# Patient Record
Sex: Female | Born: 1990 | Hispanic: Yes | Marital: Single | Smoking: Never smoker
Health system: Southern US, Community
[De-identification: ages and names within clinical notes are randomized; demographics above are authoritative.]

## PROBLEM LIST (undated history)

## (undated) HISTORY — PX: CHOLECYSTECTOMY: SHX55

---
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
## 2011-08-03 ENCOUNTER — Emergency Department: Payer: Self-pay | Admitting: Emergency Medicine

## 2011-08-03 LAB — COMPREHENSIVE METABOLIC PANEL
Albumin: 4.1 g/dL (ref 3.8–5.6)
Alkaline Phosphatase: 69 U/L — ABNORMAL LOW (ref 82–169)
Bilirubin,Total: 0.7 mg/dL (ref 0.2–1.0)
Co2: 23 mmol/L (ref 21–32)
Creatinine: 0.69 mg/dL (ref 0.60–1.30)
EGFR (Non-African Amer.): >60
Glucose: 95 mg/dL (ref 65–99)
Osmolality: 280 (ref 275–301)
Sodium: 140 mmol/L (ref 136–145)

## 2011-08-03 LAB — URINALYSIS, COMPLETE
Bilirubin,UR: NEGATIVE
Nitrite: NEGATIVE
Ph: 6 (ref 4.5–8.0)
Protein: NEGATIVE
RBC,UR: <1 /HPF (ref 0–5)
Squamous Epithelial: 2

## 2011-08-03 LAB — CBC
HCT: 37.9 % (ref 35.0–47.0)
Platelet: 211 10*3/uL (ref 150–440)
RBC: 4.23 10*6/uL (ref 3.80–5.20)
WBC: 5.9 10*3/uL (ref 3.6–11.0)

## 2011-08-03 LAB — TROPONIN I: Troponin-I: <0.02 ng/mL

## 2011-08-03 LAB — CK TOTAL AND CKMB (NOT AT ARMC): CK-MB: 1.7 ng/mL (ref 0.5–3.6)

## 2011-08-03 LAB — PREGNANCY, URINE: Pregnancy Test, Urine: NEGATIVE m[IU]/mL

## 2012-01-16 ENCOUNTER — Emergency Department: Payer: Self-pay | Admitting: Emergency Medicine

## 2012-01-16 LAB — URINALYSIS, COMPLETE
Bacteria: NONE SEEN
Glucose,UR: NEGATIVE mg/dL (ref 0–75)
Ketone: NEGATIVE
Nitrite: NEGATIVE
Protein: NEGATIVE
Specific Gravity: 1.01 (ref 1.003–1.030)
Squamous Epithelial: 5
WBC UR: 20 /HPF (ref 0–5)

## 2012-11-18 ENCOUNTER — Emergency Department: Payer: Self-pay | Admitting: Emergency Medicine

## 2012-11-18 LAB — BASIC METABOLIC PANEL
Anion Gap: 14 (ref 7–16)
BUN: 12 mg/dL (ref 7–18)
Chloride: 100 mmol/L (ref 98–107)
Co2: 23 mmol/L (ref 21–32)
Creatinine: 0.88 mg/dL (ref 0.60–1.30)
EGFR (Non-African Amer.): >60
Potassium: 3.3 mmol/L — ABNORMAL LOW (ref 3.5–5.1)
Sodium: 137 mmol/L (ref 136–145)

## 2012-11-18 LAB — URINALYSIS, COMPLETE
Glucose,UR: NEGATIVE mg/dL (ref 0–75)
Ph: 6 (ref 4.5–8.0)
Protein: NEGATIVE
RBC,UR: 2 /HPF (ref 0–5)
Squamous Epithelial: 7

## 2012-11-18 LAB — DRUG SCREEN, URINE
Amphetamines, Ur Screen: NEGATIVE (ref ?–1000)
Barbiturates, Ur Screen: NEGATIVE (ref ?–200)
Cocaine Metabolite,Ur ~~LOC~~: NEGATIVE (ref ?–300)
MDMA (Ecstasy)Ur Screen: NEGATIVE (ref ?–500)
Methadone, Ur Screen: NEGATIVE (ref ?–300)

## 2012-11-18 LAB — CBC
MCHC: 34 g/dL (ref 32.0–36.0)
MCV: 89 fL (ref 80–100)
Platelet: 299 10*3/uL (ref 150–440)
RBC: 5.18 10*6/uL (ref 3.80–5.20)
RDW: 13.5 % (ref 11.5–14.5)
WBC: 10.4 10*3/uL (ref 3.6–11.0)

## 2012-12-20 ENCOUNTER — Emergency Department: Payer: Self-pay | Admitting: Emergency Medicine

## 2012-12-20 LAB — URINALYSIS, COMPLETE
Bilirubin,UR: NEGATIVE
Glucose,UR: NEGATIVE mg/dL (ref 0–75)
Specific Gravity: 1.023 (ref 1.003–1.030)
WBC UR: 7 /HPF (ref 0–5)

## 2012-12-20 LAB — CBC
HCT: 41.3 % (ref 35.0–47.0)
HGB: 13.9 g/dL (ref 12.0–16.0)
MCH: 30.4 pg (ref 26.0–34.0)
MCHC: 33.6 g/dL (ref 32.0–36.0)
MCV: 91 fL (ref 80–100)
Platelet: 256 10*3/uL (ref 150–440)
RBC: 4.56 10*6/uL (ref 3.80–5.20)
RDW: 13.8 % (ref 11.5–14.5)
WBC: 12 10*3/uL — ABNORMAL HIGH (ref 3.6–11.0)

## 2012-12-20 LAB — WET PREP, GENITAL

## 2012-12-20 LAB — GC/CHLAMYDIA PROBE AMP

## 2014-07-31 ENCOUNTER — Emergency Department: Payer: Self-pay | Admitting: Student

## 2018-06-10 ENCOUNTER — Encounter: Payer: Self-pay | Admitting: Emergency Medicine

## 2018-06-10 ENCOUNTER — Emergency Department
Admission: EM | Admit: 2018-06-10 | Discharge: 2018-06-10 | Disposition: A | Discharge status: Home / Self Care | Payer: Medicaid Other | Attending: Emergency Medicine | Admitting: Emergency Medicine

## 2018-06-10 ENCOUNTER — Other Ambulatory Visit: Payer: Self-pay

## 2018-06-10 DIAGNOSIS — Z5321 Procedure and treatment not carried out due to patient leaving prior to being seen by health care provider: Secondary | ICD-10-CM | POA: Diagnosis not present

## 2018-06-10 DIAGNOSIS — R1084 Generalized abdominal pain: Secondary | ICD-10-CM | POA: Insufficient documentation

## 2018-06-10 LAB — CBC
HCT: 39.7 % (ref 36.0–46.0)
HEMOGLOBIN: 13.3 g/dL (ref 12.0–15.0)
MCH: 30.1 pg (ref 26.0–34.0)
MCHC: 33.5 g/dL (ref 30.0–36.0)
MCV: 89.8 fL (ref 80.0–100.0)
NRBC: 0 % (ref 0.0–0.2)
PLATELETS: 290 10*3/uL (ref 150–400)
RBC: 4.42 MIL/uL (ref 3.87–5.11)
RDW: 12 % (ref 11.5–15.5)
WBC: 9.3 10*3/uL (ref 4.0–10.5)

## 2018-06-10 LAB — URINALYSIS, COMPLETE (UACMP) WITH MICROSCOPIC
BACTERIA UA: NONE SEEN
Bilirubin Urine: NEGATIVE
GLUCOSE, UA: NEGATIVE mg/dL
Hgb urine dipstick: NEGATIVE
KETONES UR: NEGATIVE mg/dL
NITRITE: NEGATIVE
PH: 5 (ref 5.0–8.0)
Protein, ur: NEGATIVE mg/dL
Specific Gravity, Urine: 1.018 (ref 1.005–1.030)

## 2018-06-10 LAB — BASIC METABOLIC PANEL
ANION GAP: 9 (ref 5–15)
BUN: 8 mg/dL (ref 6–20)
CALCIUM: 9.5 mg/dL (ref 8.9–10.3)
CO2: 21 mmol/L — AB (ref 22–32)
Chloride: 106 mmol/L (ref 98–111)
Creatinine, Ser: 0.53 mg/dL (ref 0.44–1.00)
GLUCOSE: 107 mg/dL — AB (ref 70–99)
POTASSIUM: 3.7 mmol/L (ref 3.5–5.1)
Sodium: 136 mmol/L (ref 135–145)

## 2018-06-10 LAB — POCT PREGNANCY, URINE: Preg Test, Ur: POSITIVE — AB

## 2018-06-10 NOTE — ED Notes (Signed)
Called for VS, not in waiting room.

## 2018-06-10 NOTE — ED Notes (Signed)
Called for revital, not in WR.

## 2018-06-10 NOTE — ED Triage Notes (Signed)
C/O Right lower back / flank / back pain x 3 days.  Seen through [facility] ED two days ago for same complaint.  Started on Antibiotics - Patient has only taken one tablet of antibiotics today.

## 2018-06-10 NOTE — ED Notes (Signed)
Called for revital, not in WR. 

## 2019-09-25 ENCOUNTER — Emergency Department: Payer: Medicaid Other

## 2019-09-25 ENCOUNTER — Emergency Department
Admission: EM | Admit: 2019-09-25 | Discharge: 2019-09-25 | Disposition: A | Discharge status: Home / Self Care | Payer: Medicaid Other | Attending: Student | Admitting: Student

## 2019-09-25 ENCOUNTER — Other Ambulatory Visit: Payer: Self-pay

## 2019-09-25 ENCOUNTER — Encounter: Payer: Self-pay | Admitting: Emergency Medicine

## 2019-09-25 DIAGNOSIS — Z79899 Other long term (current) drug therapy: Secondary | ICD-10-CM | POA: Diagnosis not present

## 2019-09-25 DIAGNOSIS — U071 COVID-19: Secondary | ICD-10-CM | POA: Insufficient documentation

## 2019-09-25 DIAGNOSIS — J189 Pneumonia, unspecified organism: Secondary | ICD-10-CM | POA: Insufficient documentation

## 2019-09-25 DIAGNOSIS — R0602 Shortness of breath: Secondary | ICD-10-CM | POA: Diagnosis present

## 2019-09-25 DIAGNOSIS — Z793 Long term (current) use of hormonal contraceptives: Secondary | ICD-10-CM | POA: Diagnosis not present

## 2019-09-25 LAB — BASIC METABOLIC PANEL
Anion gap: 10 (ref 5–15)
BUN: 11 mg/dL (ref 6–20)
CO2: 22 mmol/L (ref 22–32)
Calcium: 8.7 mg/dL — ABNORMAL LOW (ref 8.9–10.3)
Chloride: 108 mmol/L (ref 98–111)
Creatinine, Ser: 0.41 mg/dL — ABNORMAL LOW (ref 0.44–1.00)
GFR calc Af Amer: >60 mL/min (ref >60)
GFR calc non Af Amer: >60 mL/min (ref >60)
Glucose, Bld: 101 mg/dL — ABNORMAL HIGH (ref 70–99)
Potassium: 3.1 mmol/L — ABNORMAL LOW (ref 3.5–5.1)
Sodium: 140 mmol/L (ref 135–145)

## 2019-09-25 LAB — CBC
HCT: 39 % (ref 36.0–46.0)
Hemoglobin: 12.9 g/dL (ref 12.0–15.0)
MCH: 28.8 pg (ref 26.0–34.0)
MCHC: 33.1 g/dL (ref 30.0–36.0)
MCV: 87.1 fL (ref 80.0–100.0)
Platelets: 242 10*3/uL (ref 150–400)
RBC: 4.48 MIL/uL (ref 3.87–5.11)
RDW: 12.6 % (ref 11.5–15.5)
WBC: 7.3 10*3/uL (ref 4.0–10.5)
nRBC: 0 % (ref 0.0–0.2)

## 2019-09-25 LAB — SARS CORONAVIRUS 2 (TAT 6-24 HRS): SARS Coronavirus 2: POSITIVE — AB

## 2019-09-25 MED ORDER — AZITHROMYCIN 500 MG PO TABS
500.0000 mg | ORAL_TABLET | Freq: Once | ORAL | Status: AC
  Administered 2019-09-25: 09:00:00 500 mg via ORAL
  Filled 2019-09-25: qty 1

## 2019-09-25 MED ORDER — SODIUM CHLORIDE 0.9 % IV BOLUS
500.0000 mL | Freq: Once | INTRAVENOUS | Status: AC
  Administered 2019-09-25: 09:00:00 500 mL via INTRAVENOUS

## 2019-09-25 MED ORDER — ONDANSETRON HCL 4 MG PO TABS: 4.0000 mg | ORAL_TABLET | Freq: Every day | ORAL | 0 refills | Status: AC | PRN

## 2019-09-25 MED ORDER — POTASSIUM CHLORIDE CRYS ER 20 MEQ PO TBCR
40.0000 meq | EXTENDED_RELEASE_TABLET | Freq: Once | ORAL | Status: AC
  Administered 2019-09-25: 40 meq via ORAL
  Filled 2019-09-25: qty 2

## 2019-09-25 MED ORDER — SODIUM CHLORIDE 0.9 % IV BOLUS
500.0000 mL | Freq: Once | INTRAVENOUS | Status: AC
  Administered 2019-09-25: 11:00:00 500 mL via INTRAVENOUS

## 2019-09-25 MED ORDER — PROMETHAZINE HCL 25 MG/ML IJ SOLN: 6.2500 mg | Freq: Once | INTRAMUSCULAR | Status: DC

## 2019-09-25 MED ORDER — BENZONATATE 100 MG PO CAPS: 100.0000 mg | ORAL_CAPSULE | Freq: Three times a day (TID) | ORAL | 0 refills | Status: AC | PRN

## 2019-09-25 MED ORDER — POTASSIUM CHLORIDE ER 10 MEQ PO TBCR: 10.0000 meq | EXTENDED_RELEASE_TABLET | Freq: Every day | ORAL | 0 refills | Status: DC

## 2019-09-25 MED ORDER — DEXAMETHASONE SODIUM PHOSPHATE 10 MG/ML IJ SOLN
10.0000 mg | Freq: Once | INTRAMUSCULAR | Status: AC
  Administered 2019-09-25: 09:00:00 10 mg via INTRAVENOUS
  Filled 2019-09-25: qty 1

## 2019-09-25 MED ORDER — PREDNISONE 10 MG PO TABS: ORAL_TABLET | ORAL | 0 refills | Status: DC

## 2019-09-25 MED ORDER — ACETAMINOPHEN-CODEINE 120-12 MG/5ML PO SOLN
5.0000 mL | Freq: Once | ORAL | Status: AC
  Administered 2019-09-25: 09:00:00 5 mL via ORAL
  Filled 2019-09-25: qty 5

## 2019-09-25 MED ORDER — ALBUTEROL SULFATE HFA 108 (90 BASE) MCG/ACT IN AERS
2.0000 | INHALATION_SPRAY | Freq: Once | RESPIRATORY_TRACT | Status: AC
  Administered 2019-09-25: 11:00:00 2 via RESPIRATORY_TRACT
  Filled 2019-09-25: qty 6.7

## 2019-09-25 MED ORDER — PROMETHAZINE HCL 25 MG/ML IJ SOLN
12.5000 mg | Freq: Once | INTRAMUSCULAR | Status: AC
  Administered 2019-09-25: 11:00:00 12.5 mg via INTRAVENOUS
  Filled 2019-09-25: qty 1

## 2019-09-25 MED ORDER — ONDANSETRON HCL 4 MG/2ML IJ SOLN
4.0000 mg | Freq: Once | INTRAMUSCULAR | Status: AC
  Administered 2019-09-25: 09:00:00 4 mg via INTRAVENOUS
  Filled 2019-09-25: qty 2

## 2019-09-25 MED ORDER — AZITHROMYCIN 250 MG PO TABS: ORAL_TABLET | ORAL | 0 refills | Status: DC

## 2019-09-25 NOTE — ED Provider Notes (Signed)
[facility] Emergency Department Provider Note  ____________________________________________  Time seen: Approximately 8:06 AM  I have reviewed the triage vital signs and the nursing notes.   HISTORY  Chief Complaint Shortness of Breath, Cough, and Chest Pain    HPI Jaime Wells is a 29 y.o. female that presents to the emergency department for evaluation of continuous cough, shortness of breath, chest discomfort for 1.5 weeks.  Patient has a consistent usually nonproductive cough.  Occasionally she coughs up white phlegm. She is coughing so much, it makes her chest hurt.  No sick contacts.  She has not had a fever.  She did have some diarrhea but this resolved.  She has not been able to eat due to her nausea.  She feels dehydrated.  No nasal congestion, sore throat, vomiting.  History reviewed. No pertinent past medical history.  There are no problems to display for this patient.   Past Surgical History:  Procedure Laterality Date  . CHOLECYSTECTOMY      Prior to Admission medications   Medication Sig Start Date End Date Taking? Authorizing Provider  acetaminophen (TYLENOL) 325 MG tablet Take by mouth. 01/04/19  Yes [provider]  cetirizine (ZYRTEC) 10 MG tablet Take by mouth.   Yes [provider]  etonogestrel (NEXPLANON) 68 MG IMPL implant 1 each by Subdermal route once.   Yes [provider]  famotidine (PEPCID) 20 MG tablet Take by mouth. 06/06/18  Yes [provider]  azithromycin (ZITHROMAX Z-PAK) 250 MG tablet Take 2 tablets (500 mg) on  Day 1,  followed by 1 tablet (250 mg) once daily on Days 2 through 5. 09/25/19   Enid Derry, PA-C  benzonatate (TESSALON PERLES) 100 MG capsule Take 1 capsule (100 mg total) by mouth 3 (three) times daily as needed. 09/25/19 09/24/20  Enid Derry, PA-C  ondansetron (ZOFRAN) 4 MG tablet Take 1 tablet (4 mg total) by mouth daily as needed for nausea or vomiting. 09/25/19 09/24/20   Enid Derry, PA-C  potassium chloride (KLOR-CON) 10 MEQ tablet Take 1 tablet (10 mEq total) by mouth daily. 09/25/19   Enid Derry, PA-C  predniSONE (DELTASONE) 10 MG tablet Take 6 tablets on day 1, take 5 tablets on day 2, take 4 tablets on day 3, take 3 tablets on day 4, take 2 tablets on day 5, take 1 tablet on day 6 09/25/19   Enid Derry, PA-C    Allergies Patient has no known allergies.  No family history on file.  Social History Social History   Tobacco Use  . Smoking status: Never Smoker  . Smokeless tobacco: Never Used  Substance Use Topics  . Alcohol use: Not Currently  . Drug use: Not on file     Review of Systems  Constitutional: No fever/chills Eyes: No visual changes. No discharge. ENT: Negative for congestion and rhinorrhea. Cardiovascular: Positive for chest discomfort with coughing. Respiratory: Positive for cough and SOB. Gastrointestinal: No abdominal pain. Positive for nausea. No vomiting.  No diarrhea.  No constipation. Musculoskeletal: Negative for musculoskeletal pain. Skin: Negative for rash, abrasions, lacerations, ecchymosis. Neurological: Negative for headaches.   ____________________________________________   PHYSICAL EXAM:  VITAL SIGNS: ED Triage Vitals [09/25/19 0716]  Enc Vitals Group     BP 118/74     Pulse Rate 83     Resp 16     Temp 99.1 F (37.3 C)     Temp Source Oral     SpO2 99 %     Weight  170 lb (77.1 kg)     Height 5\' 1"  (1.549 m)     Head Circumference      Peak Flow      Pain Score 7     Pain Loc      Pain Edu?      Excl. in [place]?      Constitutional: Alert and oriented. Well appearing and in no acute distress. Eyes: Conjunctivae are normal. PERRL. EOMI. No discharge. Head: Atraumatic. ENT: No frontal and maxillary sinus tenderness.      Ears: Tympanic membranes pearly gray with good landmarks. No discharge.      Nose: No congestion/rhinnorhea.      Mouth/Throat: Mucous membranes are moist. Oropharynx  non-erythematous. Tonsils not enlarged. No exudates. Uvula midline. Neck: No stridor.   Hematological/Lymphatic/Immunilogical: No cervical lymphadenopathy. Cardiovascular: Normal rate, regular rhythm.  Good peripheral circulation. Respiratory: Persistent nonproductive cough.  Normal respiratory effort without tachypnea or retractions. Lungs CTAB. Good air entry to the bases with no decreased or absent breath sounds. Gastrointestinal: Bowel sounds 4 quadrants. Soft and nontender to palpation. No guarding or rigidity. No palpable masses. No distention. Musculoskeletal: Full range of motion to all extremities. No gross deformities appreciated. Neurologic:  Normal speech and language. No gross focal neurologic deficits are appreciated.  Skin:  Skin is warm, dry and intact. No rash noted. Psychiatric: Mood and affect are normal. Speech and behavior are normal. Patient exhibits appropriate insight and judgement.   ____________________________________________   LABS (all labs ordered are listed, but only abnormal results are displayed)  Labs Reviewed  BASIC METABOLIC PANEL - Abnormal; Notable for the following components:      Result Value   Potassium 3.1 (*)    Glucose, Bld 101 (*)    Creatinine, Ser 0.41 (*)    Calcium 8.7 (*)    All other components within normal limits  SARS CORONAVIRUS 2 (TAT 6-24 HRS)  CBC   ____________________________________________  EKG  SR ____________________________________________  RADIOLOGY Robinette Haines, personally viewed and evaluated these images (plain radiographs) as part of my medical decision making, as well as reviewing the written report by the radiologist.  DG Chest 2 View  Result Date: 09/25/2019 CLINICAL DATA:  Cough and chest pain EXAM: CHEST - 2 VIEW COMPARISON:  11/18/2012 FINDINGS: Patchy bilateral pulmonary infiltrate greatest in the subpleural left mid lung. No edema or effusion. Normal heart size and mediastinal contours.  IMPRESSION: Patchy bilateral pneumonia with atypical/viral pattern. Electronically Signed   By: Monte Fantasia M.D.   On: 09/25/2019 07:51    ____________________________________________    PROCEDURES  Procedure(s) performed:    Procedures    Medications  acetaminophen-codeine 120-12 MG/5ML solution 5 mL (5 mLs Oral Given 09/25/19 0903)  ondansetron (ZOFRAN) injection 4 mg (4 mg Intravenous Given 09/25/19 0916)  sodium chloride 0.9 % bolus 500 mL (0 mLs Intravenous Stopped 09/25/19 1114)  azithromycin (ZITHROMAX) tablet 500 mg (500 mg Oral Given 09/25/19 0903)  dexamethasone (DECADRON) injection 10 mg (10 mg Intravenous Given 09/25/19 0916)  albuterol (VENTOLIN HFA) 108 (90 Base) MCG/ACT inhaler 2 puff (2 puffs Inhalation Given 09/25/19 1054)  sodium chloride 0.9 % bolus 500 mL (0 mLs Intravenous Stopped 09/25/19 1204)  potassium chloride SA (KLOR-CON) CR tablet 40 mEq (40 mEq Oral Given 09/25/19 1128)  promethazine (PHENERGAN) injection 12.5 mg (12.5 mg Intravenous Given 09/25/19 1129)     ____________________________________________   INITIAL IMPRESSION / ASSESSMENT AND PLAN / ED COURSE  Pertinent labs & imaging results that were  available during my care of the patient were reviewed by me and considered in my medical decision making (see chart for details).  Review of the [place] CSRS was performed in accordance of the [facility] prior to dispensing any controlled drugs.   Patient's diagnosis is consistent with atypical pneumonia. Vital signs and exam are reassuring.  Chest x-ray consistent with patchy bilateral pneumonia with atypical pattern.  Covid test is pending.  Patient's oxygen remained at 100% with ambulation.  Patient was persistently coughing when she arrived to the ER.  Cough improved with Tylenol with codeine.  Patient felt improved after IV fluids, cough medication, IV Zofran, IV Phenergan, IV steroids.  She was given a dose of azithromycin in the emergency department.  She feels  comfortable going home.  Patient feels comfortable going home. Patient will be discharged home with prescriptions for prednisone, azithromycin, Zofran, potassium, Tessalon Perles. Patient is to follow up with primary care as needed or otherwise directed. Patient is given ED precautions to return to the ED for any worsening or new symptoms.  Jaime Wells was evaluated in Emergency Department on 09/25/2019 for the symptoms described in the history of present illness. She was evaluated in the context of the global COVID-19 pandemic, which necessitated consideration that the patient might be at risk for infection with the SARS-CoV-2 virus that causes COVID-19. Institutional protocols and algorithms that pertain to the evaluation of patients at risk for COVID-19 are in a state of rapid change based on information released by regulatory bodies including the CDC and federal and state organizations. These policies and algorithms were followed during the patient's care in the ED.   ____________________________________________  FINAL CLINICAL IMPRESSION(S) / ED DIAGNOSES  Final diagnoses:  Atypical pneumonia      NEW MEDICATIONS STARTED DURING THIS VISIT:  ED Discharge Orders         Ordered    predniSONE (DELTASONE) 10 MG tablet     09/25/19 1156    azithromycin (ZITHROMAX Z-PAK) 250 MG tablet     09/25/19 1156    benzonatate (TESSALON PERLES) 100 MG capsule  3 times daily PRN     09/25/19 1156    ondansetron (ZOFRAN) 4 MG tablet  Daily PRN     09/25/19 1200    potassium chloride (KLOR-CON) 10 MEQ tablet  Daily     09/25/19 1202              This chart was dictated using voice recognition software/Dragon. Despite best efforts to proofread, errors can occur which can change the meaning. Any change was purely unintentional.    Enid Derry, PA-C 09/25/19 1501    Miguel Aschoff., MD 09/25/19 [place]

## 2019-09-25 NOTE — Discharge Instructions (Addendum)
Your chest x-ray looks like you have a viral pneumonia.  Your Covid test is still pending.  Your potassium was low so please eat a banana per day.  You can take Zofran for your nausea.  Please begin antibiotics tomorrow to prevent any bacterial infection.  Please begin steroids tomorrow for inflammation. You can use Tessalon Perles for your cough. These medications are transmitted through the breastmilk so try to formula feed baby as much as possible.  Please return to the emergency department for any worsening of symptoms.

## 2019-09-25 NOTE — ED Triage Notes (Signed)
Says pain in her chest ewith breathing especially with productive cough for a week.  Denies fever.  Has some uncontrollable couging in triage.

## 2019-09-25 NOTE — ED Notes (Signed)
See triage note  Presents with 1 week hx of cough  States cough has been intermittently prod  Low grade temp on arrival also has had decreased appetite and diarrhea

## 2019-09-26 NOTE — ED Notes (Signed)
Pt notified of positive Covid results, home care instructions reviewed.

## 2021-04-15 IMAGING — CR DG CHEST 2V
1 series · 2 of 2 positions shown · non-contrast
Comparison: 11/18/2012

CLINICAL DATA: Cough and chest pain

EXAM:
CHEST - 2 VIEW

[Series 1: dg chest 2 view · 0.14mm/px · 2 of 2 slices shown]
[im 1/2]
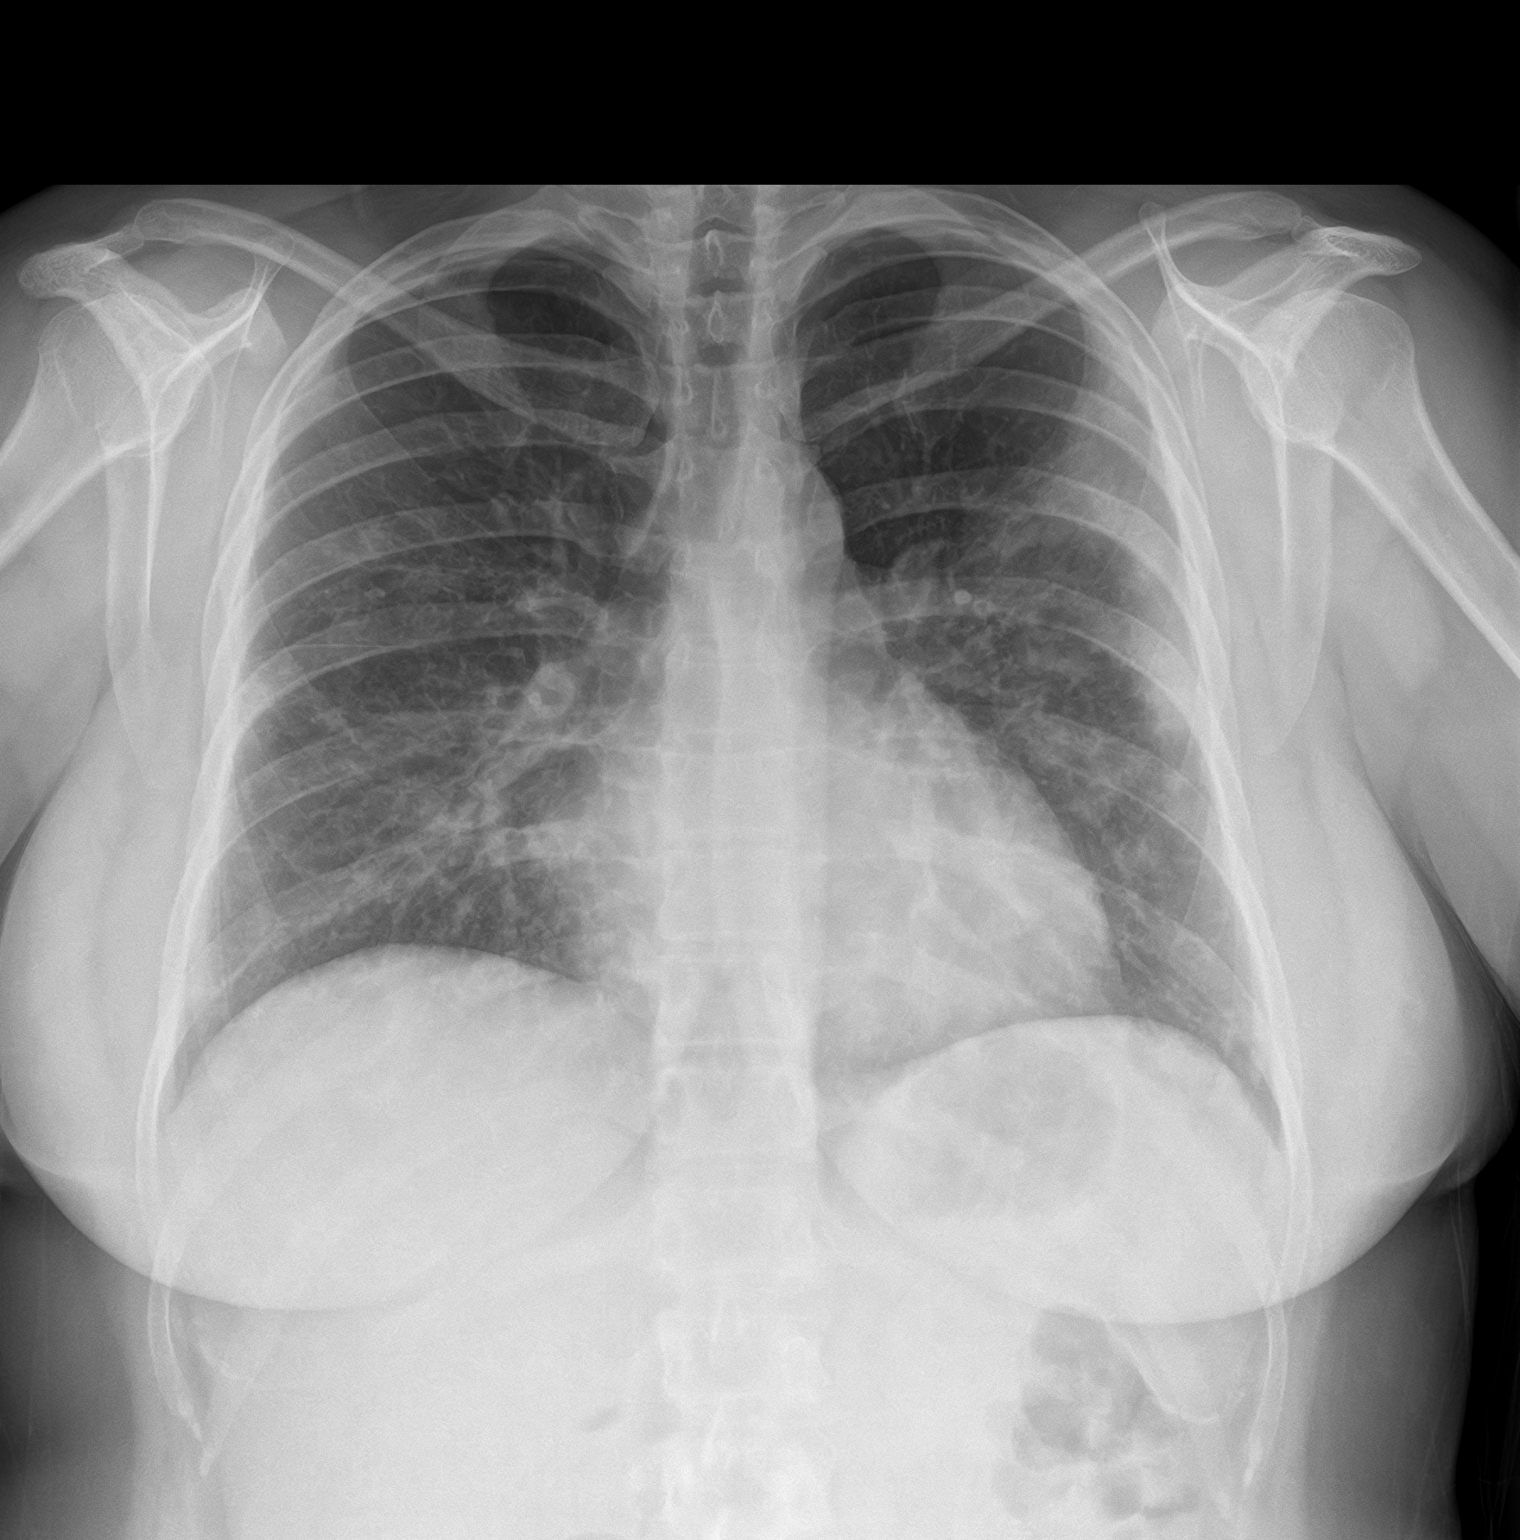
[im 2/2]
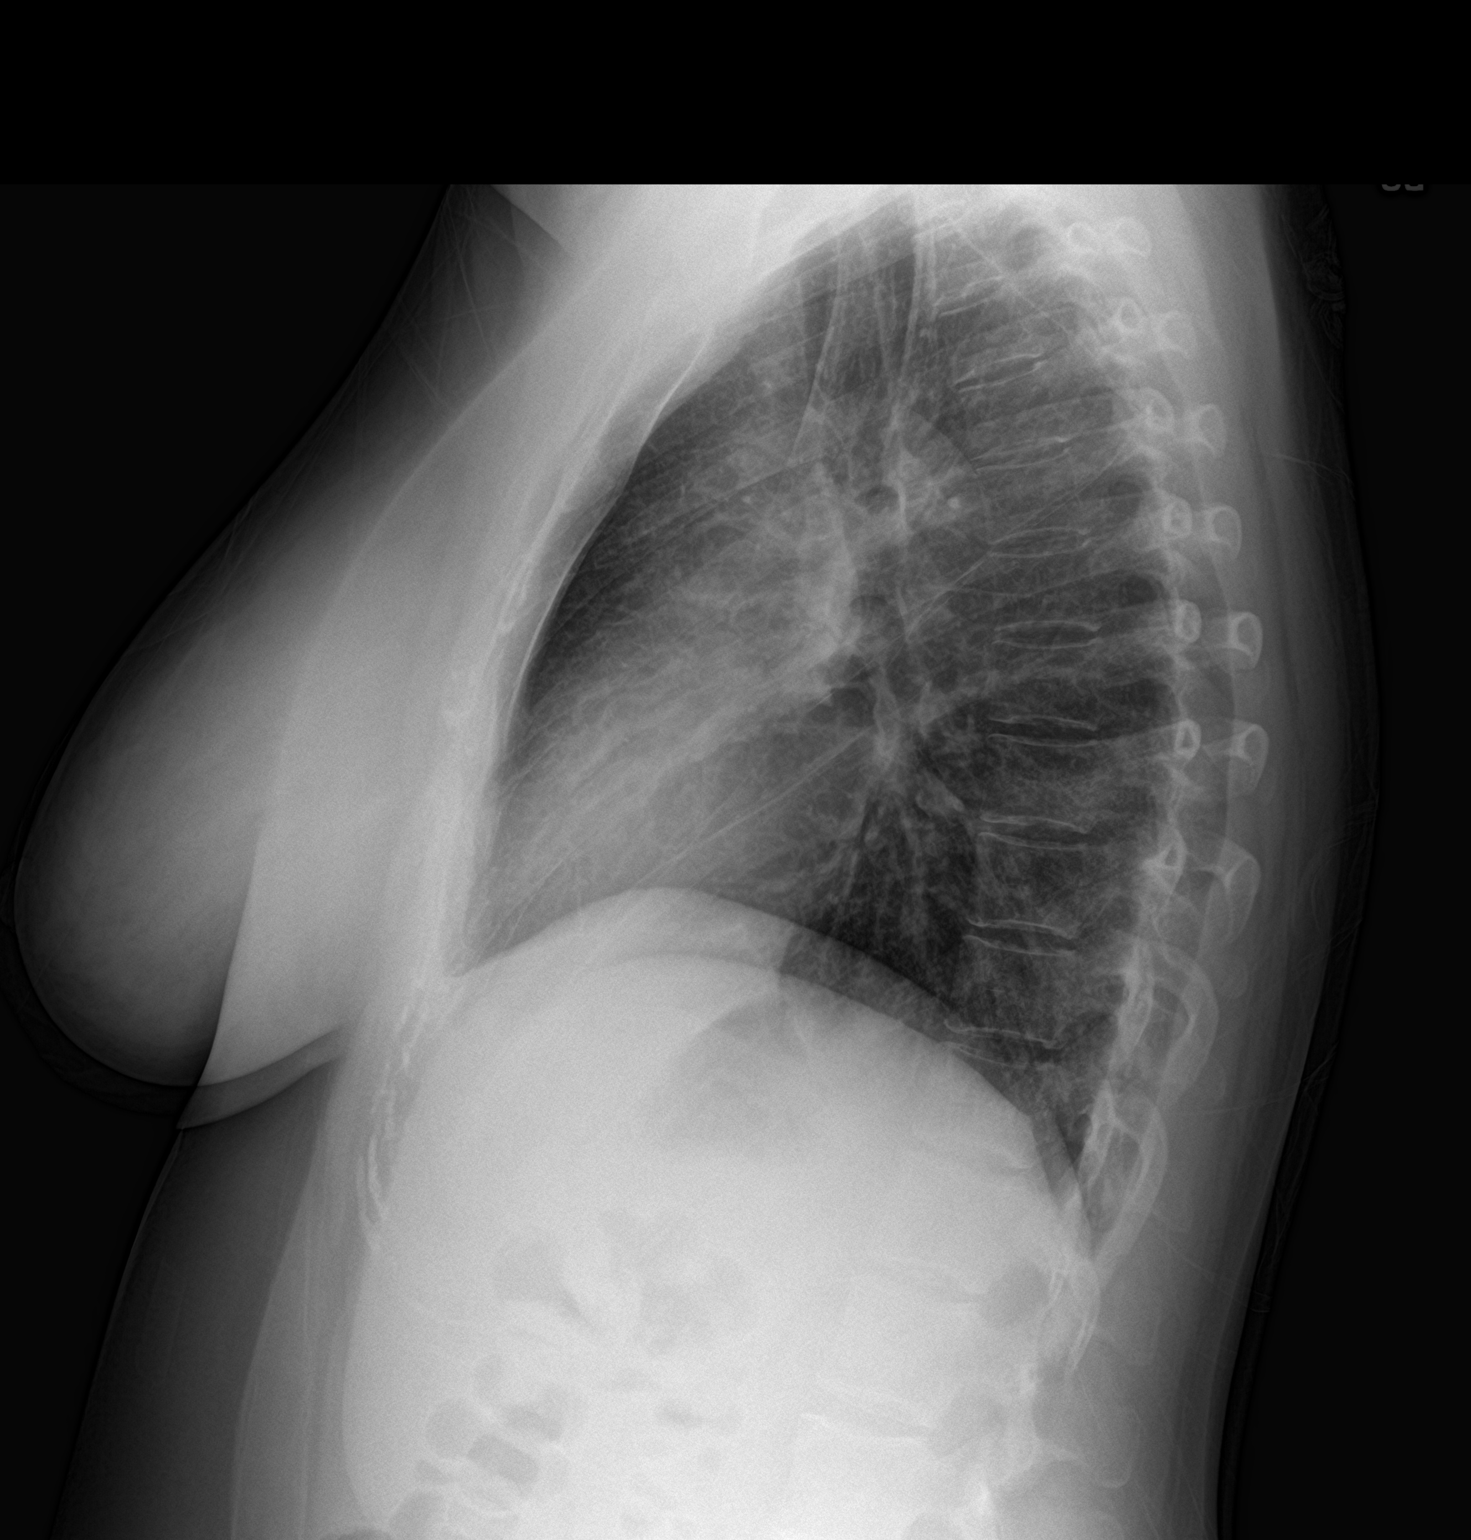

[2 of 2 positions shown; findings below may reference images not displayed]

FINDINGS: Patchy bilateral pulmonary infiltrate greatest in the subpleural
left mid lung. No edema or effusion. Normal heart size and
mediastinal contours.
IMPRESSION: Patchy bilateral pneumonia with atypical/viral pattern.

## 2021-08-27 ENCOUNTER — Other Ambulatory Visit: Payer: Self-pay

## 2021-08-27 ENCOUNTER — Ambulatory Visit
Admission: EM | Admit: 2021-08-27 | Discharge: 2021-08-27 | Disposition: A | Discharge status: Home / Self Care | Payer: Medicaid Other

## 2021-08-27 NOTE — ED Notes (Signed)
Pt called 2 time sin lobby no answer.  ? ?Pt called by front dek to the phone no answered  ?

## 2021-08-28 ENCOUNTER — Ambulatory Visit
Admission: EM | Admit: 2021-08-28 | Discharge: 2021-08-28 | Disposition: A | Discharge status: Home / Self Care | Payer: Medicaid Other

## 2021-08-28 ENCOUNTER — Telehealth: Payer: Self-pay | Admitting: Emergency Medicine

## 2021-08-28 ENCOUNTER — Encounter: Payer: Self-pay | Admitting: Emergency Medicine

## 2021-08-28 DIAGNOSIS — J069 Acute upper respiratory infection, unspecified: Secondary | ICD-10-CM | POA: Diagnosis not present

## 2021-08-28 MED ORDER — PROMETHAZINE-DM 6.25-15 MG/5ML PO SYRP: 5.0000 mL | ORAL_SOLUTION | Freq: Four times a day (QID) | ORAL | 0 refills | Status: DC | PRN

## 2021-08-28 MED ORDER — PREDNISONE 20 MG PO TABS: 40.0000 mg | ORAL_TABLET | Freq: Every day | ORAL | 0 refills | Status: AC

## 2021-08-28 MED ORDER — AMOXICILLIN-POT CLAVULANATE 875-125 MG PO TABS: 1.0000 | ORAL_TABLET | Freq: Two times a day (BID) | ORAL | 0 refills | Status: DC

## 2021-08-28 MED ORDER — BENZONATATE 100 MG PO CAPS: 100.0000 mg | ORAL_CAPSULE | Freq: Three times a day (TID) | ORAL | 0 refills | Status: AC

## 2021-08-28 MED ORDER — BENZONATATE 100 MG PO CAPS: 100.0000 mg | ORAL_CAPSULE | Freq: Three times a day (TID) | ORAL | 0 refills | Status: DC

## 2021-08-28 MED ORDER — AMOXICILLIN-POT CLAVULANATE 875-125 MG PO TABS: 1.0000 | ORAL_TABLET | Freq: Two times a day (BID) | ORAL | 0 refills | Status: AC

## 2021-08-28 MED ORDER — PROMETHAZINE-DM 6.25-15 MG/5ML PO SYRP: 5.0000 mL | ORAL_SOLUTION | Freq: Four times a day (QID) | ORAL | 0 refills | Status: AC | PRN

## 2021-08-28 NOTE — ED Triage Notes (Signed)
Patient c/o cough and sore throat for over a month.  Patient reports lots of mucus and drainage.  Patient denies fevers.  ?

## 2021-08-28 NOTE — ED Provider Notes (Signed)
?[facility] ? ? ? ?CSN: [id number] ?Arrival date & time: 08/28/21  0825 ? ? ?  ? ?History   ?Chief Complaint ?Chief Complaint  ?Patient presents with  ? Cough  ? Sore Throat  ? ? ?HPI ?Jaime Wells is a 31 y.o. female.  ? ?Patient presents with chills, body aches intermittent hoarseness, sore throat, productive cough with green to brown sputum, nasal congestion and rhinorrhea for 1 month.  Cough is interfering with sleep.  Has been painful to swallow but able to tolerate some food and liquids.  Initially no known sick contacts.  Has attempted use of Mucinex, Tylenol, Advil, Chloraseptic spray and cough drops which have been somewhat helpful.  Home COVID test negative x3.  Denies fever, shortness of breath, chest pain or tightness. ? ?History reviewed. No pertinent past medical history. ? ?There are no problems to display for this patient. ? ? ?Past Surgical History:  ?Procedure Laterality Date  ? CHOLECYSTECTOMY    ? ? ?OB History   ? ? Gravida  ?1  ? Para  ?   ? Term  ?   ? Preterm  ?   ? AB  ?   ? Living  ?   ?  ? ? SAB  ?   ? IAB  ?   ? Ectopic  ?   ? Multiple  ?   ? Live Births  ?   ?   ?  ?  ? ? ? ?Home Medications   ? ?Prior to Admission medications   ?Medication Sig Start Date End Date Taking? Authorizing Provider  ?cetirizine (ZYRTEC) 10 MG tablet Take by mouth.   Yes [provider]  ?etonogestrel (NEXPLANON) 68 MG IMPL implant 1 each by Subdermal route once.   Yes [provider]  ?fluticasone (FLONASE) 50 MCG/ACT nasal spray Place 1 spray into both nostrils daily.   Yes [provider]  ?acetaminophen (TYLENOL) 325 MG tablet Take by mouth. 01/04/19   [provider]  ?azithromycin (ZITHROMAX Z-PAK) 250 MG tablet Take 2 tablets (500 mg) on  Day 1,  followed by 1 tablet (250 mg) once daily on Days 2 through 5. 09/25/19   Enid Derry, PA-C  ?famotidine (PEPCID) 20 MG tablet Take by mouth. 06/06/18   [provider]  ?potassium chloride (KLOR-CON) 10 MEQ  tablet Take 1 tablet (10 mEq total) by mouth daily. 09/25/19   Enid Derry, PA-C  ?predniSONE (DELTASONE) 10 MG tablet Take 6 tablets on day 1, take 5 tablets on day 2, take 4 tablets on day 3, take 3 tablets on day 4, take 2 tablets on day 5, take 1 tablet on day 6 09/25/19   Enid Derry, PA-C  ? ? ?Family History ?History reviewed. No pertinent family history. ? ?Social History ?Social History  ? ?Tobacco Use  ? Smoking status: Never  ? Smokeless tobacco: Never  ?Vaping Use  ? Vaping Use: Never used  ?Substance Use Topics  ? Alcohol use: Not Currently  ? Drug use: Never  ? ? ? ?Allergies   ?Patient has no known allergies. ? ? ?Review of Systems ?Review of Systems  ?Constitutional:  Positive for chills. Negative for activity change, appetite change, diaphoresis, fatigue, fever and unexpected weight change.  ?HENT:  Positive for congestion, rhinorrhea and sore throat. Negative for dental problem, drooling, ear discharge, ear pain, facial swelling, hearing loss, mouth sores, nosebleeds, postnasal drip, sinus pressure, sinus pain, sneezing, tinnitus, trouble swallowing and voice change.   ?Eyes:  Negative for discharge.  ?Respiratory:  Positive for cough. Negative for apnea, choking, chest tightness, shortness of breath, wheezing and stridor.   ?Cardiovascular: Negative.   ?Gastrointestinal: Negative.   ?Musculoskeletal:  Positive for myalgias. Negative for arthralgias, back pain, gait problem, joint swelling, neck pain and neck stiffness.  ?Skin: Negative.   ?Neurological: Negative.   ? ? ?Physical Exam ?Triage Vital Signs ?ED Triage Vitals  ?Enc Vitals Group  ?   BP 08/28/21 0842 125/87  ?   Pulse Rate 08/28/21 0842 69  ?   Resp 08/28/21 0842 14  ?   Temp 08/28/21 0842 99 ?F (37.2 ?C)  ?   Temp Source 08/28/21 0842 Oral  ?   SpO2 08/28/21 0842 98 %  ?   Weight 08/28/21 0840 179 lb (81.2 kg)  ?   Height 08/28/21 0840 5\' 1"  (1.549 m)  ?   Head Circumference --   ?   Peak Flow --   ?   Pain Score --   ?   Pain Loc  --   ?   Pain Edu? --   ?   Excl. in [place]? --   ? ?No data found. ? ?Updated Vital Signs ?BP 125/87 (BP Location: Right Arm)   Pulse 69   Temp 99 ?F (37.2 ?C) (Oral)   Resp 14   Ht 5\' 1"  (1.549 m)   Wt 179 lb (81.2 kg)   SpO2 98%   Breastfeeding Yes   BMI 33.82 kg/m?  ? ?Visual Acuity ?Right Eye Distance:   ?Left Eye Distance:   ?Bilateral Distance:   ? ?Right Eye Near:   ?Left Eye Near:    ?Bilateral Near:    ? ?Physical Exam ?Constitutional:   ?   Appearance: Normal appearance.  ?HENT:  ?   Head: Normocephalic.  ?   Right Ear: Tympanic membrane, ear canal and external ear normal.  ?   Left Ear: Tympanic membrane, ear canal and external ear normal.  ?   Nose: Congestion present. No rhinorrhea.  ?   Mouth/Throat:  ?   Mouth: Mucous membranes are moist.  ?   Pharynx: Posterior oropharyngeal erythema present.  ?Eyes:  ?   Extraocular Movements: Extraocular movements intact.  ?Cardiovascular:  ?   Rate and Rhythm: Normal rate and regular rhythm.  ?   Pulses: Normal pulses.  ?   Heart sounds: Normal heart sounds.  ?Pulmonary:  ?   Effort: Pulmonary effort is normal.  ?   Breath sounds: Normal breath sounds.  ?Musculoskeletal:  ?   Cervical back: Normal range of motion and neck supple.  ?Skin: ?   General: Skin is warm and dry.  ?Neurological:  ?   General: No focal deficit present.  ?   Mental Status: She is alert and oriented to person, place, and time.  ?Psychiatric:     ?   Mood and Affect: Mood normal.     ?   Behavior: Behavior normal.  ? ? ? ?UC Treatments / Results  ?Labs ?(all labs ordered are listed, but only abnormal results are displayed) ?Labs Reviewed - No data to display ? ?EKG ? ? ?Radiology ?No results found. ? ?Procedures ?Procedures (including critical care time) ? ?Medications Ordered in UC ?Medications - No data to display ? ?Initial Impression / Assessment and Plan / UC Course  ?I have reviewed the triage vital signs and the nursing notes. ? ?Pertinent labs & imaging results that were  available during my care of  the patient were reviewed by me and considered in my medical decision making (see chart for details). ? ?Upper respiratory tract infection ? ?Vital signs are stable, O2 saturation 98% on room air, lungs clear to auscultation, etiology of symptoms most likely began as viral but due to timeline of illness most likely bacteria is present, discussed with patient, Augmentin 10-day course prescribed as well as prednisone 40 mg burst, Tessalon and Promethazine DM prescribed for management of cough, recommended good Rx as patient's insurance will not cover cough suppressants, may continue use of over-the-counter medications for additional comfort, may follow-up with urgent care as needed for persisting symptoms, work note given ?Final Clinical Impressions(s) / UC Diagnoses  ? ?Final diagnoses:  ?None  ? ?Discharge Instructions   ?None ?  ? ?ED Prescriptions   ?None ?  ? ?PDMP not reviewed this encounter. ?  ?Valinda Hoar, NP ?08/28/21 5400 ? ?

## 2021-08-28 NOTE — Telephone Encounter (Signed)
Pt requesting rx be sent to another pharmacy. Rxs sent to [facility] ?

## 2021-08-28 NOTE — Discharge Instructions (Signed)
Begin use of Augmentin twice daily for the next 10 days, this medication is to help clear bacteria ? ?Begin use of prednisone every morning with food for the next 5 days, this medication is to reduce inflammation and irritation to your upper airways ? ?You may use Tessalon pill every 8 hours to help calm your coughing ? ?You may use cough syrup every 6 hours for additional comfort, be mindful this medication make you drowsy ?   ?You can take Tylenol and/or Ibuprofen as needed for fever reduction and pain relief. ?  ?For cough: honey 1/2 to 1 teaspoon (you can dilute the honey in water or another fluid). You can use a humidifier for chest congestion and cough.  If you don't have a humidifier, you can sit in the bathroom with the hot shower running.    ?  ?For sore throat: try warm salt water gargles, cepacol lozenges, throat spray, warm tea or water with lemon/honey, popsicles or ice, or OTC cold relief medicine for throat discomfort. ?  ?For congestion: take a daily anti-histamine like Zyrtec, Claritin, and a oral decongestant, such as pseudoephedrine.  You can also use Flonase 1-2 sprays in each nostril daily. ?  ?It is important to stay hydrated: drink plenty of fluids (water, gatorade/powerade/pedialyte, juices, or teas) to keep your throat moisturized and help further relieve irritation/discomfort.  ?

## 2021-08-29 ENCOUNTER — Ambulatory Visit: Payer: Self-pay

## 2022-05-12 ENCOUNTER — Emergency Department
Admission: EM | Admit: 2022-05-12 | Discharge: 2022-05-12 | Disposition: A | Discharge status: Home / Self Care | Payer: Medicaid Other | Attending: Emergency Medicine | Admitting: Emergency Medicine

## 2022-05-12 ENCOUNTER — Other Ambulatory Visit: Payer: Self-pay

## 2022-05-12 ENCOUNTER — Emergency Department: Payer: Medicaid Other

## 2022-05-12 DIAGNOSIS — R1032 Left lower quadrant pain: Secondary | ICD-10-CM | POA: Insufficient documentation

## 2022-05-12 DIAGNOSIS — R109 Unspecified abdominal pain: Secondary | ICD-10-CM | POA: Diagnosis present

## 2022-05-12 DIAGNOSIS — N83202 Unspecified ovarian cyst, left side: Secondary | ICD-10-CM

## 2022-05-12 DIAGNOSIS — O039 Complete or unspecified spontaneous abortion without complication: Secondary | ICD-10-CM

## 2022-05-12 LAB — URINALYSIS, ROUTINE W REFLEX MICROSCOPIC
Bilirubin Urine: NEGATIVE
Glucose, UA: NEGATIVE mg/dL
Hgb urine dipstick: NEGATIVE
Ketones, ur: NEGATIVE mg/dL
Leukocytes,Ua: NEGATIVE
Nitrite: NEGATIVE
Protein, ur: NEGATIVE mg/dL
Specific Gravity, Urine: 1.009 (ref 1.005–1.030)
pH: 5 (ref 5.0–8.0)

## 2022-05-12 LAB — COMPREHENSIVE METABOLIC PANEL
ALT: 26 U/L (ref 0–44)
AST: 19 U/L (ref 15–41)
Albumin: 4.3 g/dL (ref 3.5–5.0)
Alkaline Phosphatase: 81 U/L (ref 38–126)
Anion gap: 8 (ref 5–15)
BUN: 12 mg/dL (ref 6–20)
CO2: 23 mmol/L (ref 22–32)
Calcium: 9.4 mg/dL (ref 8.9–10.3)
Chloride: 106 mmol/L (ref 98–111)
Creatinine, Ser: 0.66 mg/dL (ref 0.44–1.00)
GFR, Estimated: >60 mL/min (ref >60)
Glucose, Bld: 99 mg/dL (ref 70–99)
Potassium: 4.1 mmol/L (ref 3.5–5.1)
Sodium: 137 mmol/L (ref 135–145)
Total Bilirubin: 0.6 mg/dL (ref 0.3–1.2)
Total Protein: 7.6 g/dL (ref 6.5–8.1)

## 2022-05-12 LAB — CBC
HCT: 43.1 % (ref 36.0–46.0)
Hemoglobin: 13.7 g/dL (ref 12.0–15.0)
MCH: 28.8 pg (ref 26.0–34.0)
MCHC: 31.8 g/dL (ref 30.0–36.0)
MCV: 90.7 fL (ref 80.0–100.0)
Platelets: 289 10*3/uL (ref 150–400)
RBC: 4.75 MIL/uL (ref 3.87–5.11)
RDW: 12.5 % (ref 11.5–15.5)
WBC: 8.7 10*3/uL (ref 4.0–10.5)
nRBC: 0 % (ref 0.0–0.2)

## 2022-05-12 LAB — POC URINE PREG, ED: Preg Test, Ur: POSITIVE — AB

## 2022-05-12 LAB — LIPASE, BLOOD: Lipase: 33 U/L (ref 11–51)

## 2022-05-12 LAB — HCG, QUANTITATIVE, PREGNANCY: hCG, Beta Chain, Quant, S: 32 m[IU]/mL — ABNORMAL HIGH (ref <5)

## 2022-05-12 NOTE — ED Triage Notes (Signed)
Pt here for LLQ pain. Pt ambulatory with steady gait. NAD noted.

## 2022-05-12 NOTE — ED Provider Notes (Signed)
[facility] Provider Note    Event Date/Time   First MD Initiated Contact with Patient 05/12/22 1537     (approximate)  History   Chief Complaint: Abdominal Pain  HPI  Jaime Wells is a 31 y.o. female presents to the emergency department for possible miscarriage or retained products versus ectopic.  According to the patient and outside record.  Provided by the patient and print the patient has been following up with her provider [facility].  Patient had a 9-week abortion on 04/08/2022 took her second dose of medication 04/10/2022.  Following this the patient had a negative pregnancy test 11/25.  Patient then began experiencing some very faint vaginal bleeding on 12/1, went to her provider and again had a positive pregnancy test, was followed by the quantitative beta-hCG 12/5 at 150 and then again 12/8 a 84.  Patient states throughout this time she has been using birth control in addition to condoms.  Patient began experiencing left lower quadrant abdominal cramping for 5 days ago and now states intermittent sharp pain to the area.  Was referred by her provider to the emergency department.  Patient has not had an ultrasound at any point.  Physical Exam     General: Awake, no distress.  CV:  Good peripheral perfusion.  Regular rate and rhythm  Resp:  Normal effort.  Equal breath sounds bilaterally.  Abd:  No distention.  Soft, very slight lower left lower quadrant tenderness.  ED Results / Procedures / Treatments   RADIOLOGY  Ultrasound read as no intrauterine pregnancy no gestational sac.  There is a small cyst on the right ovary 2.8 cm cyst on the left ovary.  I spoke to [facility], they recommend serial quant's until the patient is negative.   MEDICATIONS ORDERED IN ED: Medications - No data to display   IMPRESSION / MDM / ASSESSMENT AND PLAN / ED COURSE  I reviewed the triage vital signs and the nursing notes.  Patient's presentation is most consistent  with acute presentation with potential threat to life or bodily function.  Patient presents emergency department for concern for possible pregnancy versus ectopic versus incomplete abortion.  We will check labs, quantitative beta-hCG and obtain an ultrasound.  Patient does have very slight left lower quadrant tenderness on my exam.   Lab work has resulted showing a normal CBC normal chemistry negative lipase.  Patient's beta-hCG is 32 today.  Urinalysis is normal.  Patient's ultrasound shows no identifiable pregnancy, does show small complex appearing cyst on the left ovary.  I spoke to OB/GYN midwife who is discussing with her attending.  Patient states minimal discomfort states the pain is intermittent little sharp pains to the left lower quadrant.  Reassuringly downtrending beta quant from 150 on 12/5, 84 on 12/8 and now 32 today.  I spoke with the midwife who has run the patient by Dr. Logan Bores.  He agrees with plan to discharge home have the patient follow-up next couple days with [facility] for a quantitative beta-hCG.  If quantitative beta-hCG is negative he does not believe patient will require any other significant follow-up however if positive the patient will need to follow-up with him in the office.  I discussed return precautions with the patient.  She is agreeable to plan.   FINAL CLINICAL IMPRESSION(S) / ED DIAGNOSES   Left lower quadrant pain   Note:  This document was prepared using Dragon voice recognition software and may include unintentional dictation errors.   Ithiel Liebler,  Caryn Bee, MD 05/12/22 2238

## 2022-05-12 NOTE — Discharge Instructions (Addendum)
Please follow-up with [facility] for a blood pregnancy test.  Your level today is 32.  If your pregnancy test is still positive please call the number provided for Dr. Logan Bores to arrange a follow-up appointment.  Return to the emergency department for any worsening pain or any symptom concerning to yourself.
